# Patient Record
Sex: Female | Born: 1940 | Race: White | Hispanic: No | Marital: Married | Smoking: Current every day smoker
Health system: Southern US, Community
[De-identification: ages and names within clinical notes are randomized; demographics above are authoritative.]

## PROBLEM LIST (undated history)

## (undated) DIAGNOSIS — H9190 Unspecified hearing loss, unspecified ear: Secondary | ICD-10-CM

## (undated) DIAGNOSIS — Z9289 Personal history of other medical treatment: Secondary | ICD-10-CM

## (undated) DIAGNOSIS — D649 Anemia, unspecified: Secondary | ICD-10-CM

## (undated) HISTORY — DX: Anemia, unspecified: D64.9

## (undated) HISTORY — PX: CARDIAC SURGERY: SHX584

## (undated) HISTORY — DX: Personal history of other medical treatment: Z92.89

## (undated) HISTORY — DX: Unspecified hearing loss, unspecified ear: H91.90

---

## 2004-10-10 ENCOUNTER — Ambulatory Visit: Payer: Self-pay | Admitting: Surgery

## 2004-10-16 ENCOUNTER — Ambulatory Visit: Payer: Self-pay | Admitting: Surgery

## 2005-02-27 ENCOUNTER — Ambulatory Visit: Payer: Self-pay | Admitting: Ophthalmology

## 2005-03-04 ENCOUNTER — Ambulatory Visit: Payer: Self-pay | Admitting: Ophthalmology

## 2007-04-27 ENCOUNTER — Emergency Department: Payer: Self-pay | Admitting: Emergency Medicine

## 2007-04-27 ENCOUNTER — Other Ambulatory Visit: Payer: Self-pay

## 2008-06-05 ENCOUNTER — Ambulatory Visit: Payer: Self-pay | Admitting: Internal Medicine

## 2009-04-16 ENCOUNTER — Inpatient Hospital Stay: Payer: Self-pay | Admitting: Internal Medicine

## 2010-09-24 ENCOUNTER — Ambulatory Visit: Payer: Self-pay | Admitting: Internal Medicine

## 2011-04-15 ENCOUNTER — Ambulatory Visit: Payer: Self-pay | Admitting: Internal Medicine

## 2011-04-21 ENCOUNTER — Ambulatory Visit: Payer: Self-pay | Admitting: Internal Medicine

## 2011-05-26 ENCOUNTER — Ambulatory Visit: Payer: Self-pay | Admitting: Internal Medicine

## 2011-06-15 ENCOUNTER — Ambulatory Visit: Payer: Self-pay | Admitting: Internal Medicine

## 2011-07-16 ENCOUNTER — Ambulatory Visit: Payer: Self-pay | Admitting: Internal Medicine

## 2012-04-13 ENCOUNTER — Ambulatory Visit: Payer: Self-pay | Admitting: Ophthalmology

## 2012-04-26 ENCOUNTER — Ambulatory Visit: Payer: Self-pay | Admitting: Ophthalmology

## 2012-10-18 ENCOUNTER — Ambulatory Visit: Payer: Self-pay | Admitting: Internal Medicine

## 2012-11-15 ENCOUNTER — Ambulatory Visit: Payer: Self-pay | Admitting: Internal Medicine

## 2013-09-28 ENCOUNTER — Encounter: Payer: Self-pay | Admitting: *Deleted

## 2013-09-29 ENCOUNTER — Telehealth: Payer: Self-pay | Admitting: *Deleted

## 2013-09-29 NOTE — Telephone Encounter (Signed)
Phone call from friend/family member stating that the cyst area on the patients leg is now painful.  Aware no sooner appointment available.  Advised to check with Dr Juel Burrow so he is aware of the change to see if care is needed prior to appointment (antibiotic) on Oct 29.  She was not with the patient so she is not sure if the area is red or inflamed.  Aware to use tylenol or advil and possibly try ice or heat to the area for comfort.

## 2013-09-30 ENCOUNTER — Ambulatory Visit: Payer: Self-pay | Admitting: Internal Medicine

## 2013-10-06 ENCOUNTER — Ambulatory Visit: Payer: Self-pay | Admitting: Internal Medicine

## 2013-10-12 ENCOUNTER — Encounter: Payer: Self-pay | Admitting: General Surgery

## 2013-10-12 ENCOUNTER — Ambulatory Visit (INDEPENDENT_AMBULATORY_CARE_PROVIDER_SITE_OTHER): Payer: Medicare Other | Admitting: General Surgery

## 2013-10-12 VITALS — BP 92/62 | HR 66 | Resp 12 | Ht <= 58 in | Wt 87.0 lb

## 2013-10-12 DIAGNOSIS — R2240 Localized swelling, mass and lump, unspecified lower limb: Secondary | ICD-10-CM | POA: Insufficient documentation

## 2013-10-12 DIAGNOSIS — R2241 Localized swelling, mass and lump, right lower limb: Secondary | ICD-10-CM

## 2013-10-12 DIAGNOSIS — R229 Localized swelling, mass and lump, unspecified: Secondary | ICD-10-CM

## 2013-10-12 NOTE — Patient Instructions (Addendum)
The patient is aware to call back for any questions or concerns. Use ice pack on and off for today May remove dressing in 2-3 days Steri strip will come off in one week.

## 2013-10-12 NOTE — Progress Notes (Signed)
Patient ID: Sarah Bright, female   DOB: 21-Apr-1941, 72 y.o.   MRN: [id number]  Chief Complaint  Patient presents with  . Other    right thigh cyst    HPI Sarah Bright is a 72 y.o. female.  Here today for evaluation of a right thigh mass that has been there for at least 2 months. Referred by Dr. Juel Burrow.  Denies pain.  She is present today with her sister-in-law who is POA, she states that it does seem to have gotten larger over the past 2 months. HPI  Past Medical History  Diagnosis Date  . Anemia   . History of blood transfusion   . Hearing loss     Past Surgical History  Procedure Laterality Date  . 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 No pertinent family history.  Social History History  Substance Use Topics  . Smoking status: Current Every Day Smoker -- 0.25 packs/day for 55 years    Types: Cigarettes  . Smokeless tobacco: Not on file  . Alcohol Use: Not on file    No Known Allergies  Current Outpatient Prescriptions  Medication Sig Dispense Refill  . aspirin 81 MG tablet Take 81 mg by mouth daily.      . Calcium Carbonate-Vitamin D 600-400 MG-UNIT per tablet Take 1 tablet by mouth daily.      . digoxin (LANOXIN) 0.125 MG tablet Take 0.125 mg by mouth daily.      Marland Kitchen diltiazem (CARDIZEM) 120 MG tablet Take 120 mg by mouth daily.      Marland Kitchen gemfibrozil (LOPID) 600 MG tablet Take 600 mg by mouth daily.      . Multiple Vitamin (MULTIVITAMIN) tablet Take 1 tablet by mouth daily.      Marland Kitchen omeprazole (PRILOSEC) 20 MG capsule Take 20 mg by mouth daily.      . simvastatin (ZOCOR) 20 MG tablet Take 20 mg by mouth daily.      Marland Kitchen tiotropium (SPIRIVA) 18 MCG inhalation capsule Place 18 mcg into inhaler and inhale daily.       No current facility-administered medications for this visit.    Review of Systems Review of Systems  Constitutional: Negative.   Respiratory: Negative.   Cardiovascular: Negative.     Blood pressure 92/62, pulse 66, resp.  rate 12, height 4\' 10"  (1.473 m), weight 87 lb (39.463 kg).  Physical Exam Physical Exam  Constitutional: She is oriented to person, place, and time. She appears well-developed.  Eyes: Conjunctivae are normal. No scleral icterus.  Neck: Neck supple. No thyromegaly present.  Cardiovascular: Normal rate, regular rhythm and normal heart sounds.   Pulses:      Dorsalis pedis pulses are 2+ on the right side, and 2+ on the left side.       Posterior tibial pulses are 2+ on the right side, and 2+ on the left side.  No lower leg edema  Pulmonary/Chest: Effort normal and breath sounds normal.  Abdominal: Soft. Normal appearance and bowel sounds are normal. No hernia.  Lymphadenopathy:    She has no cervical adenopathy.       Right: No inguinal adenopathy present.       Left: No inguinal adenopathy present.  Neurological: She is alert and oriented to person, place, and time.  Skin: Skin is warm and dry.  Right thigh soft tissue mass 3-4 cm firm deep seated mass inner right thigh junction of medial lower third. Appears  subfascial.    Data Reviewed Office notes. US showed a large subfascial mass and core biopsy completed.  Assessment    Right thigh soft tissue mass 3-4 cm firm deep seeded mass inner right thigh junction of medial lower third     Plan    Await path report. If benign would recommend simple excision in SDS. Discussed fully with pt's POA.      Targeted US of mass of right lower inner thigh reveals a 4 cm long, 2.5cm deep isoechoic mass. It is subfascial in location.  Borders are indistinct. 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 Core biopsy recommended and completed today with consent.  3 ml of 1% xylocaine mixed with 0.5% marcaine was instilled. After skin prep with Chloraprep, tiny stab incision made. Bard core biopsy device was used with US guidance and 4 cores obtained. Sent for pathology. Streistrip, telfa and tegaderm applied. Procedure  tolerated well. SANKAR,SEEPLAPUTHUR G  10/13/2013, 6:36 AM

## 2013-10-13 ENCOUNTER — Encounter: Payer: Self-pay | Admitting: General Surgery

## 2013-10-14 ENCOUNTER — Ambulatory Visit: Payer: Self-pay | Admitting: Internal Medicine

## 2013-10-18 ENCOUNTER — Ambulatory Visit: Payer: Self-pay | Admitting: Internal Medicine

## 2013-10-20 LAB — PATHOLOGY

## 2013-10-26 ENCOUNTER — Encounter: Payer: Self-pay | Admitting: General Surgery

## 2013-11-02 ENCOUNTER — Ambulatory Visit: Payer: Self-pay | Admitting: Internal Medicine

## 2013-11-05 LAB — BRONCHIAL WASH CULTURE

## 2013-11-14 ENCOUNTER — Ambulatory Visit: Payer: Self-pay | Admitting: Internal Medicine

## 2013-11-14 LAB — CBC WITH DIFFERENTIAL/PLATELET
Basophil #: 0.1 10*3/uL (ref 0.0–0.1)
Basophil %: 0.7 %
Eosinophil #: 0.6 10*3/uL (ref 0.0–0.7)
MCH: 31.6 pg (ref 26.0–34.0)
MCHC: 34.4 g/dL (ref 32.0–36.0)
Monocyte #: 0.6 x10 3/mm (ref 0.2–0.9)
Monocyte %: 6.1 %
Neutrophil #: 7 10*3/uL — ABNORMAL HIGH (ref 1.4–6.5)
Platelet: 302 10*3/uL (ref 150–440)
RBC: 4.2 10*6/uL (ref 3.80–5.20)
RDW: 14 % (ref 11.5–14.5)
WBC: 10.4 10*3/uL (ref 3.6–11.0)

## 2013-11-14 LAB — PROTIME-INR: Prothrombin Time: 13.1 secs (ref 11.5–14.7)

## 2013-11-22 LAB — PATHOLOGY REPORT

## 2013-11-23 LAB — CULTURE, FUNGUS WITHOUT SMEAR

## 2013-11-24 ENCOUNTER — Other Ambulatory Visit (HOSPITAL_COMMUNITY): Payer: Self-pay | Admitting: Internal Medicine

## 2013-11-24 DIAGNOSIS — R918 Other nonspecific abnormal finding of lung field: Secondary | ICD-10-CM

## 2013-11-25 ENCOUNTER — Encounter (HOSPITAL_COMMUNITY): Payer: Self-pay | Admitting: Pharmacy Technician

## 2013-11-25 ENCOUNTER — Other Ambulatory Visit (HOSPITAL_COMMUNITY): Payer: Self-pay | Admitting: Radiology

## 2013-11-28 ENCOUNTER — Ambulatory Visit (HOSPITAL_COMMUNITY)
Admission: RE | Admit: 2013-11-28 | Discharge: 2013-11-28 | Disposition: A | Discharge status: Home / Self Care | Payer: Medicare Other | Source: Ambulatory Visit | Attending: Internal Medicine | Admitting: Internal Medicine

## 2013-11-28 ENCOUNTER — Encounter (HOSPITAL_COMMUNITY): Payer: Self-pay

## 2013-11-28 DIAGNOSIS — Z79899 Other long term (current) drug therapy: Secondary | ICD-10-CM | POA: Insufficient documentation

## 2013-11-28 DIAGNOSIS — H9190 Unspecified hearing loss, unspecified ear: Secondary | ICD-10-CM | POA: Insufficient documentation

## 2013-11-28 DIAGNOSIS — C3410 Malignant neoplasm of upper lobe, unspecified bronchus or lung: Secondary | ICD-10-CM | POA: Insufficient documentation

## 2013-11-28 DIAGNOSIS — Z7982 Long term (current) use of aspirin: Secondary | ICD-10-CM | POA: Insufficient documentation

## 2013-11-28 DIAGNOSIS — R918 Other nonspecific abnormal finding of lung field: Secondary | ICD-10-CM

## 2013-11-28 DIAGNOSIS — D649 Anemia, unspecified: Secondary | ICD-10-CM | POA: Insufficient documentation

## 2013-11-28 DIAGNOSIS — F17200 Nicotine dependence, unspecified, uncomplicated: Secondary | ICD-10-CM | POA: Insufficient documentation

## 2013-11-28 DIAGNOSIS — C3490 Malignant neoplasm of unspecified part of unspecified bronchus or lung: Secondary | ICD-10-CM | POA: Insufficient documentation

## 2013-11-28 LAB — APTT: aPTT: 27 seconds (ref 24–37)

## 2013-11-28 LAB — CBC
HCT: 38 % (ref 36.0–46.0)
MCHC: 33.9 g/dL (ref 30.0–36.0)
Platelets: 250 10*3/uL (ref 150–400)
RDW: 14.4 % (ref 11.5–15.5)
WBC: 8.1 10*3/uL (ref 4.0–10.5)

## 2013-11-28 LAB — PROTIME-INR
INR: 1.01 (ref 0.00–1.49)
Prothrombin Time: 13.1 seconds (ref 11.6–15.2)

## 2013-11-28 MED ORDER — SODIUM CHLORIDE 0.9 % IV SOLN
Freq: Once | INTRAVENOUS | Status: AC
  Administered 2013-11-28: 12:00:00 via INTRAVENOUS

## 2013-11-28 MED ORDER — LIDOCAINE HCL 1 % IJ SOLN
INTRAMUSCULAR | Status: AC
  Filled 2013-11-28: qty 10

## 2013-11-28 MED ORDER — FENTANYL CITRATE 0.05 MG/ML IJ SOLN
INTRAMUSCULAR | Status: AC
  Filled 2013-11-28: qty 4

## 2013-11-28 MED ORDER — SODIUM CHLORIDE 0.9 % IV SOLN
INTRAVENOUS | Status: AC | PRN
  Administered 2013-11-28: 75 mL/h via INTRAVENOUS

## 2013-11-28 MED ORDER — LORAZEPAM 2 MG/ML IJ SOLN
INTRAMUSCULAR | Status: AC | PRN
  Administered 2013-11-28: 1 mg via INTRAVENOUS

## 2013-11-28 MED ORDER — FENTANYL CITRATE 0.05 MG/ML IJ SOLN
INTRAMUSCULAR | Status: AC | PRN
  Administered 2013-11-28 (×2): 25 ug via INTRAVENOUS

## 2013-11-28 MED ORDER — MIDAZOLAM HCL 2 MG/2ML IJ SOLN
INTRAMUSCULAR | Status: AC
  Filled 2013-11-28: qty 4

## 2013-11-28 NOTE — ED Notes (Signed)
MD aware of BP, fluid bolus being given.

## 2013-11-28 NOTE — ED Notes (Signed)
MD at bedside.  Speaking with pt and family.

## 2013-11-28 NOTE — H&P (Signed)
Chief Complaint: Lung mass  HPI: Sarah Bright is an 72 y.o. female who is being worked up for a left lung mass. She underwent LUL biopsy 2 weeks ago and tolerated it well. However, he pathology was not conclusive. She is scheduled for another attempt. She has done well since last procedure. No new c/o, meds, or allergies.  Past Medical History:  Past Medical History  Diagnosis Date  . Anemia   . History of blood transfusion   . Hearing loss     Past Surgical History:  Past Surgical History  Procedure Laterality Date  . 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 No pertinent family history.  Social History:  reports that she has been smoking Cigarettes.  She has a 13.75 pack-year smoking history. She does not have any smokeless tobacco history on file. She reports that she does not use illicit drugs. Her alcohol history is not on file.  Allergies: No Known Allergies  Medications:   Medication List    ASK your doctor about these medications       aspirin 81 MG tablet  Take 81 mg by mouth daily.     calcium carbonate 600 MG Tabs tablet  Commonly known as:  OS-CAL  Take 600 mg by mouth daily with breakfast.     digoxin 0.125 MG tablet  Commonly known as:  LANOXIN  Take 0.0625 mg by mouth daily.     diltiazem 120 MG 24 hr capsule  Commonly known as:  CARDIZEM CD  Take 240 mg by mouth daily.     ferrous gluconate 324 MG tablet  Commonly known as:  FERGON  Take 324 mg by mouth daily with breakfast.     gemfibrozil 600 MG tablet  Commonly known as:  LOPID  Take 600 mg by mouth daily.     multivitamin tablet  Take 1 tablet by mouth daily.     omeprazole 20 MG capsule  Commonly known as:  PRILOSEC  Take 20 mg by mouth daily.     simvastatin 20 MG tablet  Commonly known as:  ZOCOR  Take 20 mg by mouth daily.     tiotropium 18 MCG inhalation capsule  Commonly known as:  SPIRIVA  Place 18 mcg into inhaler and inhale  daily.        Please HPI for pertinent positives, otherwise complete 10 system ROS negative.  Physical Exam: Temp: 98.1, HR: 103, RR: 20, BP: 103/56   General Appearance:  Alert, cooperative, no distress, appears stated age  Head:  Normocephalic, without obvious abnormality, atraumatic  ENT: Unremarkable  Neck: Supple, symmetrical, trachea midline  Lungs:   Clear to auscultation bilaterally, no w/r/r  Chest Wall:  No tenderness or deformity  Heart:  Regular rate and rhythm, S1, S2 normal, no murmur, rub or gallop.  Neurologic: Normal affect, no gross deficits.   No results found for this or any previous visit (from the past 48 hour(s)). No results found.  Assessment/Plan LUL lung mass For repeat CT guided biopsy today Re-explained procedure to pt and family, including POA Risks of infection, bleeding/hemorrhage, PTX/chest tube and use of sedation discussed. Labs pending Consent signed in chart  Brayton El PA-C 11/28/2013, 12:12 PM

## 2013-11-28 NOTE — H&P (Signed)
Agree.  For repeat biopsy of LUL mass under CT guidance.

## 2013-11-28 NOTE — ED Notes (Signed)
IV fluids increased for BP.  Dr Fredia Sorrow in to see pt and family.

## 2013-11-28 NOTE — ED Notes (Signed)
BP cuff  moved to leg

## 2013-11-28 NOTE — Procedures (Signed)
Procedure:  CT guided core biopsy of LUL lung mass Findings:  Posterolateral margin of LUL mass targeted for repeat biopsy.  18 G core biopsy samples x 5 via 17 G needle.  No PTX by CT.

## 2013-11-28 NOTE — ED Notes (Signed)
O2 d/c'd 

## 2013-12-01 ENCOUNTER — Ambulatory Visit: Payer: Self-pay | Admitting: Internal Medicine

## 2013-12-12 ENCOUNTER — Ambulatory Visit: Payer: Self-pay | Admitting: Internal Medicine

## 2013-12-15 ENCOUNTER — Ambulatory Visit: Payer: Self-pay | Admitting: Internal Medicine

## 2013-12-29 LAB — CBC CANCER CENTER
Basophil #: 0.1 x10 3/mm (ref 0.0–0.1)
Basophil %: 0.6 %
EOS ABS: 0.5 x10 3/mm (ref 0.0–0.7)
Eosinophil %: 5.5 %
HCT: 37 % (ref 35.0–47.0)
HGB: 12.1 g/dL (ref 12.0–16.0)
Lymphocyte #: 1.6 x10 3/mm (ref 1.0–3.6)
Lymphocyte %: 16.5 %
MCH: 30 pg (ref 26.0–34.0)
MCHC: 32.6 g/dL (ref 32.0–36.0)
MCV: 92 fL (ref 80–100)
Monocyte #: 1 x10 3/mm — ABNORMAL HIGH (ref 0.2–0.9)
Monocyte %: 10.6 %
Neutrophil #: 6.5 x10 3/mm (ref 1.4–6.5)
Neutrophil %: 66.8 %
Platelet: 302 x10 3/mm (ref 150–440)
RBC: 4.02 10*6/uL (ref 3.80–5.20)
RDW: 14.3 % (ref 11.5–14.5)
WBC: 9.7 x10 3/mm (ref 3.6–11.0)

## 2014-01-05 LAB — CBC CANCER CENTER
BASOS ABS: 0 x10 3/mm (ref 0.0–0.1)
BASOS PCT: 0.5 %
Eosinophil #: 0.3 x10 3/mm (ref 0.0–0.7)
Eosinophil %: 3.5 %
HCT: 34.7 % — ABNORMAL LOW (ref 35.0–47.0)
HGB: 11.5 g/dL — ABNORMAL LOW (ref 12.0–16.0)
LYMPHS ABS: 0.8 x10 3/mm — AB (ref 1.0–3.6)
LYMPHS PCT: 9.4 %
MCH: 29.7 pg (ref 26.0–34.0)
MCHC: 33.2 g/dL (ref 32.0–36.0)
MCV: 89 fL (ref 80–100)
MONO ABS: 1.1 x10 3/mm — AB (ref 0.2–0.9)
Monocyte %: 12.4 %
Neutrophil #: 6.5 x10 3/mm (ref 1.4–6.5)
Neutrophil %: 74.2 %
Platelet: 379 x10 3/mm (ref 150–440)
RBC: 3.88 10*6/uL (ref 3.80–5.20)
RDW: 14.4 % (ref 11.5–14.5)
WBC: 8.7 x10 3/mm (ref 3.6–11.0)

## 2014-01-12 LAB — CBC CANCER CENTER
BASOS ABS: 0 x10 3/mm (ref 0.0–0.1)
Basophil %: 0.4 %
Eosinophil #: 0.2 x10 3/mm (ref 0.0–0.7)
Eosinophil %: 2.6 %
HCT: 28.4 % — ABNORMAL LOW (ref 35.0–47.0)
HGB: 9.3 g/dL — ABNORMAL LOW (ref 12.0–16.0)
LYMPHS ABS: 0.6 x10 3/mm — AB (ref 1.0–3.6)
Lymphocyte %: 5.9 %
MCH: 28.9 pg (ref 26.0–34.0)
MCHC: 32.6 g/dL (ref 32.0–36.0)
MCV: 89 fL (ref 80–100)
Monocyte #: 1 x10 3/mm — ABNORMAL HIGH (ref 0.2–0.9)
Monocyte %: 10.6 %
NEUTROS ABS: 7.6 x10 3/mm — AB (ref 1.4–6.5)
NEUTROS PCT: 80.5 %
Platelet: 489 x10 3/mm — ABNORMAL HIGH (ref 150–440)
RBC: 3.21 10*6/uL — AB (ref 3.80–5.20)
RDW: 14.3 % (ref 11.5–14.5)
WBC: 9.4 x10 3/mm (ref 3.6–11.0)

## 2014-01-15 ENCOUNTER — Ambulatory Visit: Payer: Self-pay | Admitting: Radiation Oncology

## 2014-02-12 ENCOUNTER — Ambulatory Visit: Payer: Self-pay | Admitting: Radiation Oncology

## 2014-03-02 LAB — CBC CANCER CENTER
Basophil #: 0 x10 3/mm (ref 0.0–0.1)
Basophil %: 0.2 %
EOS PCT: 2.6 %
Eosinophil #: 0.3 x10 3/mm (ref 0.0–0.7)
HCT: 35.5 % (ref 35.0–47.0)
HGB: 11.2 g/dL — AB (ref 12.0–16.0)
Lymphocyte #: 0.8 x10 3/mm — ABNORMAL LOW (ref 1.0–3.6)
Lymphocyte %: 6.6 %
MCH: 26.4 pg (ref 26.0–34.0)
MCHC: 31.4 g/dL — AB (ref 32.0–36.0)
MCV: 84 fL (ref 80–100)
MONO ABS: 1.1 x10 3/mm — AB (ref 0.2–0.9)
MONOS PCT: 8.5 %
NEUTROS PCT: 82.1 %
Neutrophil #: 10.4 x10 3/mm — ABNORMAL HIGH (ref 1.4–6.5)
Platelet: 365 x10 3/mm (ref 150–440)
RBC: 4.22 10*6/uL (ref 3.80–5.20)
RDW: 17.8 % — ABNORMAL HIGH (ref 11.5–14.5)
WBC: 12.7 x10 3/mm — AB (ref 3.6–11.0)

## 2014-03-15 ENCOUNTER — Ambulatory Visit: Payer: Self-pay | Admitting: Radiation Oncology

## 2014-04-14 ENCOUNTER — Ambulatory Visit: Payer: Self-pay | Admitting: Internal Medicine

## 2014-04-14 ENCOUNTER — Ambulatory Visit: Payer: Self-pay | Admitting: Radiation Oncology

## 2014-05-15 ENCOUNTER — Ambulatory Visit: Payer: Self-pay | Admitting: Radiation Oncology

## 2014-06-14 ENCOUNTER — Ambulatory Visit: Payer: Self-pay | Admitting: Internal Medicine

## 2014-07-02 ENCOUNTER — Inpatient Hospital Stay: Payer: Self-pay | Admitting: Specialist

## 2014-07-02 LAB — BASIC METABOLIC PANEL
Anion Gap: 10 (ref 7–16)
BUN: 12 mg/dL (ref 7–18)
Calcium, Total: 9 mg/dL (ref 8.5–10.1)
Chloride: 94 mmol/L — ABNORMAL LOW (ref 98–107)
Co2: 23 mmol/L (ref 21–32)
Creatinine: 0.68 mg/dL (ref 0.60–1.30)
Glucose: 143 mg/dL — ABNORMAL HIGH (ref 65–99)
OSMOLALITY: 257 (ref 275–301)
Potassium: 4.7 mmol/L (ref 3.5–5.1)
SODIUM: 127 mmol/L — AB (ref 136–145)

## 2014-07-02 LAB — CBC
HCT: 38.7 % (ref 35.0–47.0)
HGB: 11.9 g/dL — ABNORMAL LOW (ref 12.0–16.0)
MCH: 25.1 pg — AB (ref 26.0–34.0)
MCHC: 30.7 g/dL — AB (ref 32.0–36.0)
MCV: 82 fL (ref 80–100)
Platelet: 336 10*3/uL (ref 150–440)
RBC: 4.73 10*6/uL (ref 3.80–5.20)
RDW: 17.2 % — ABNORMAL HIGH (ref 11.5–14.5)
WBC: 6.9 10*3/uL (ref 3.6–11.0)

## 2014-07-02 LAB — TROPONIN I: Troponin-I: <0.02 ng/mL

## 2014-07-03 LAB — BASIC METABOLIC PANEL
Anion Gap: 9 (ref 7–16)
BUN: 13 mg/dL (ref 7–18)
CREATININE: 0.68 mg/dL (ref 0.60–1.30)
Calcium, Total: 8.5 mg/dL (ref 8.5–10.1)
Chloride: 97 mmol/L — ABNORMAL LOW (ref 98–107)
Co2: 23 mmol/L (ref 21–32)
EGFR (African American): >60
EGFR (Non-African Amer.): >60
Glucose: 168 mg/dL — ABNORMAL HIGH (ref 65–99)
Osmolality: 263 (ref 275–301)
Potassium: 4.4 mmol/L (ref 3.5–5.1)
SODIUM: 129 mmol/L — AB (ref 136–145)

## 2014-07-03 LAB — CBC WITH DIFFERENTIAL/PLATELET
BASOS ABS: 0 10*3/uL (ref 0.0–0.1)
BASOS PCT: 0.2 %
Eosinophil #: 0 10*3/uL (ref 0.0–0.7)
Eosinophil %: 0 %
HCT: 29.3 % — ABNORMAL LOW (ref 35.0–47.0)
HGB: 9.2 g/dL — ABNORMAL LOW (ref 12.0–16.0)
LYMPHS PCT: 6.2 %
Lymphocyte #: 0.2 10*3/uL — ABNORMAL LOW (ref 1.0–3.6)
MCH: 25.3 pg — ABNORMAL LOW (ref 26.0–34.0)
MCHC: 31.4 g/dL — ABNORMAL LOW (ref 32.0–36.0)
MCV: 81 fL (ref 80–100)
MONOS PCT: 4.3 %
Monocyte #: 0.2 x10 3/mm (ref 0.2–0.9)
Neutrophil #: 3.3 10*3/uL (ref 1.4–6.5)
Neutrophil %: 89.3 %
PLATELETS: 220 10*3/uL (ref 150–440)
RBC: 3.63 10*6/uL — AB (ref 3.80–5.20)
RDW: 16.9 % — AB (ref 11.5–14.5)
WBC: 3.7 10*3/uL (ref 3.6–11.0)

## 2014-07-12 ENCOUNTER — Ambulatory Visit: Payer: Self-pay | Admitting: Internal Medicine

## 2014-07-15 ENCOUNTER — Ambulatory Visit: Payer: Self-pay | Admitting: Internal Medicine

## 2014-07-15 DEATH — deceased

## 2014-08-20 IMAGING — CT CT ASPIRATION
3 of 7 series · 19 of 46 positions shown, 26 images · non-contrast
Comparison: none

CLINICAL DATA: Left upper lobe mass. Smoking history. Nondiagnostic
bronchoscopy.

[Series 2: routine chest wo · axial · 0.71mm/px · z∈[+1096,+1206]mm · 7 of 30 slices shown]
[im 2/30  soft-tissue]
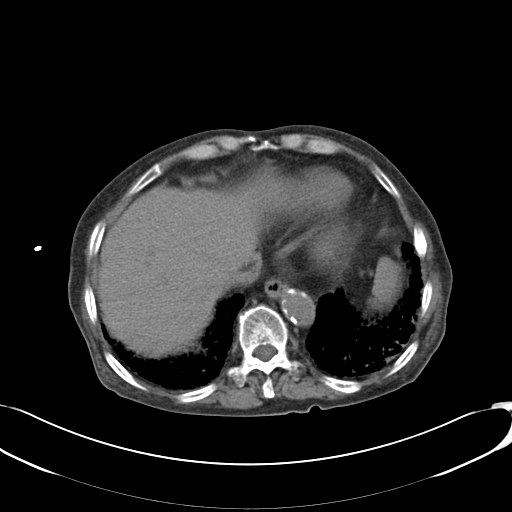
[im 6/30  soft-tissue]
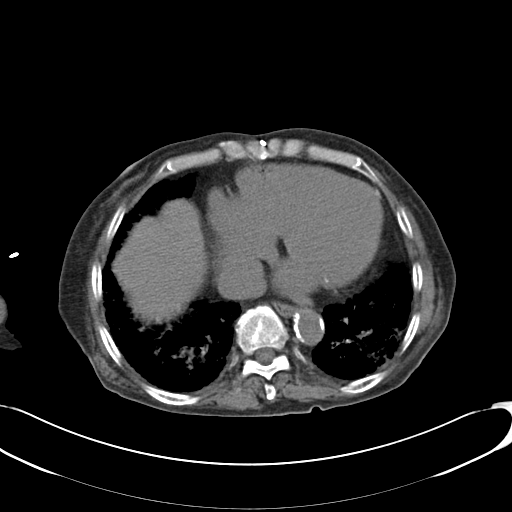
[im 10/30  soft-tissue]
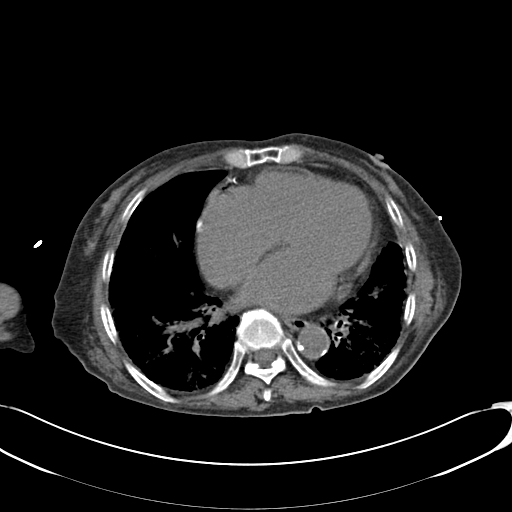
[im 14/30  soft-tissue]
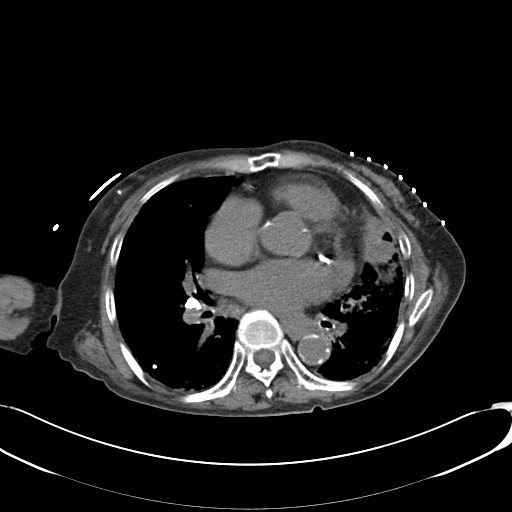
[im 16/30  soft-tissue]
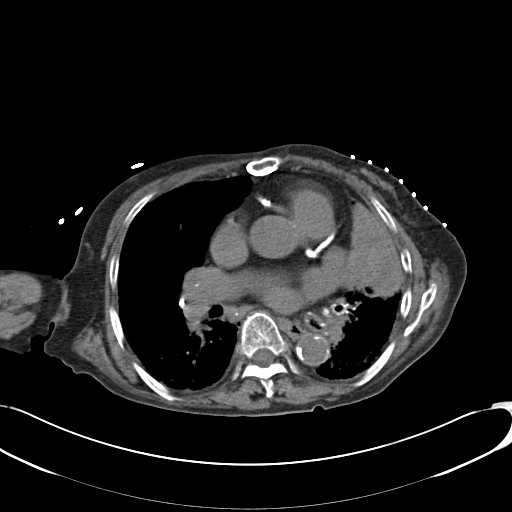
[im 20/30  soft-tissue]
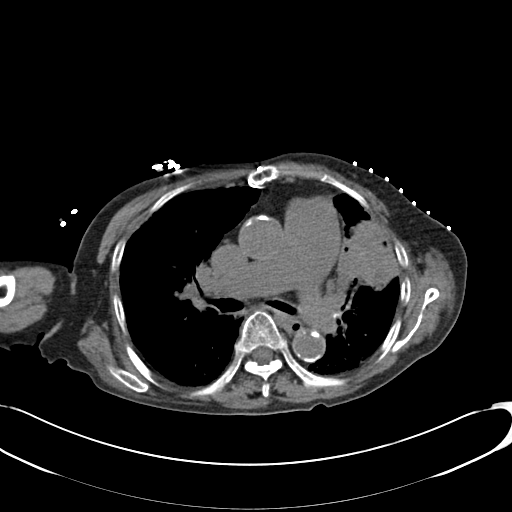
[im 24/30  soft-tissue]
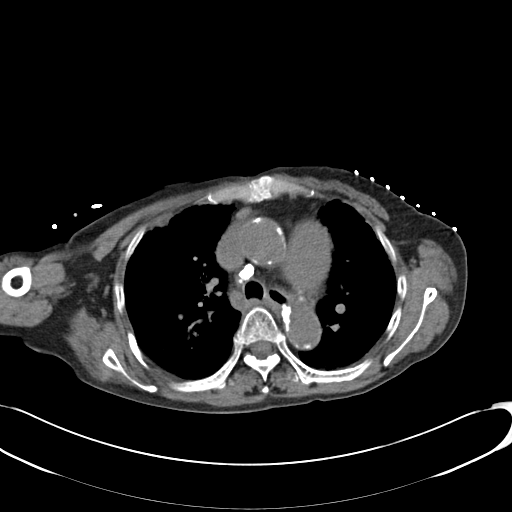

[Series 5: (hospital) 2.4 b30s · axial · 1.10mm/px · z∈[+1174,+1178]mm · 9 of 20 slices shown, 15 images]
[im 2/20  soft-tissue]
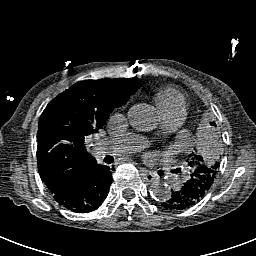
[im 2/20  bone]
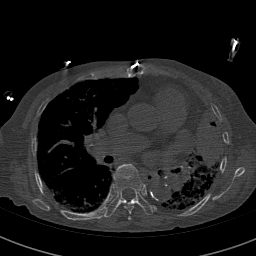
[im 4/20  soft-tissue]
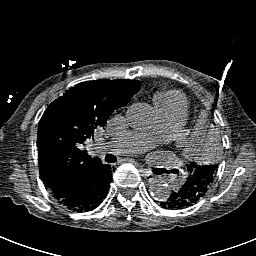
[im 6/20  soft-tissue]
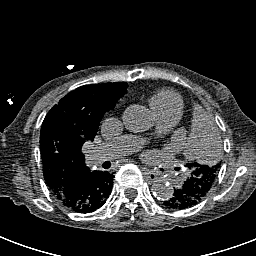
[im 6/20  lung]
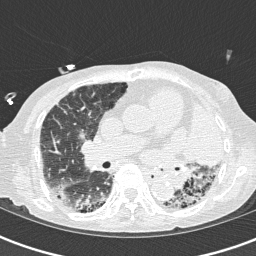
[im 8/20  soft-tissue]
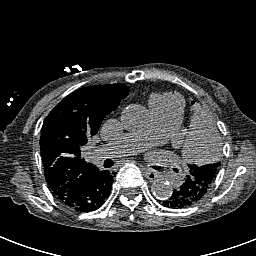
[im 8/20  lung]
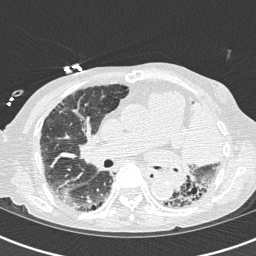
[im 10/20  soft-tissue]
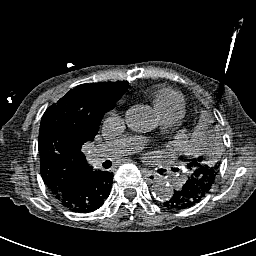
[im 10/20  lung]
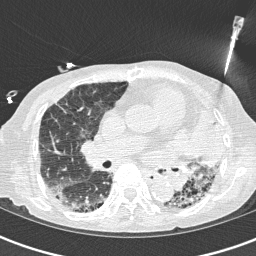
[im 12/20  soft-tissue]
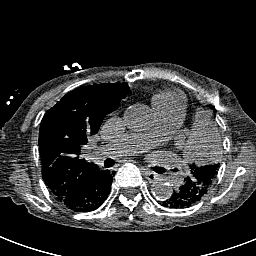
[im 14/20  soft-tissue]
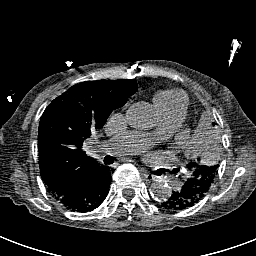
[im 16/20  soft-tissue]
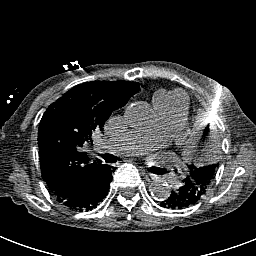
[im 18/20  soft-tissue]
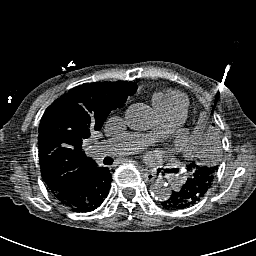
[im 18/20  lung]
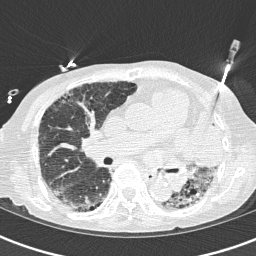
[im 18/20  bone]
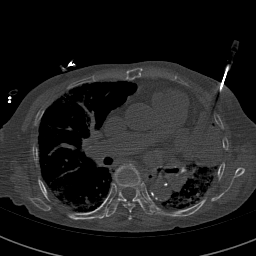

[Series 8: routine chest wo cor · coronal · 0.29mm/px · 3 of 103 slices shown, 4 images]
[im 26/103  soft-tissue]
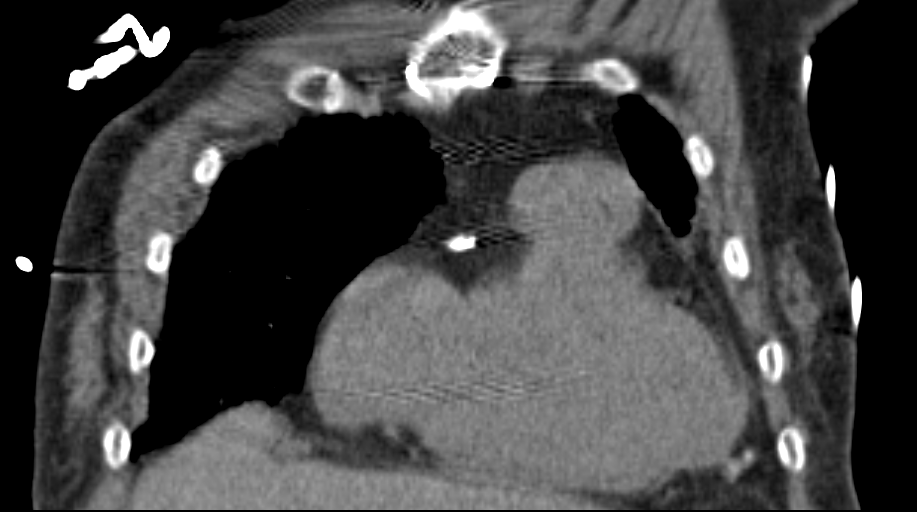
[im 52/103  soft-tissue]
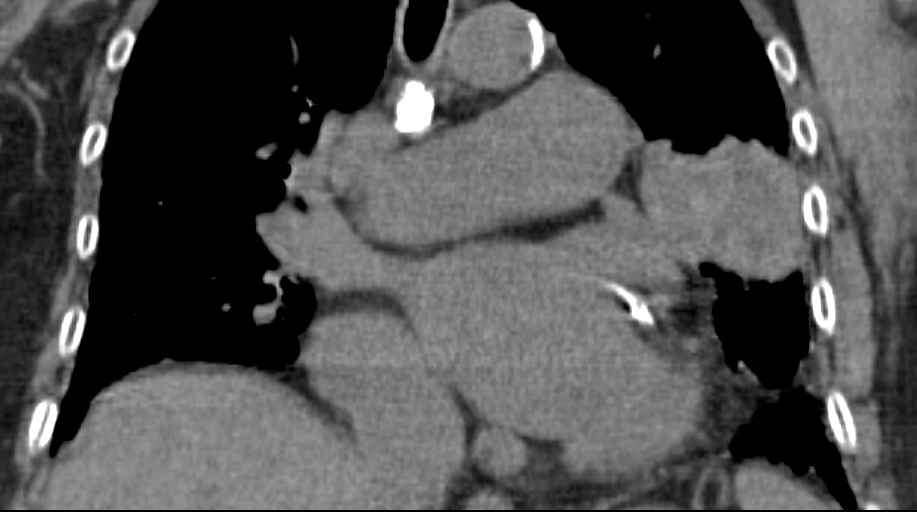
[im 52/103  bone]
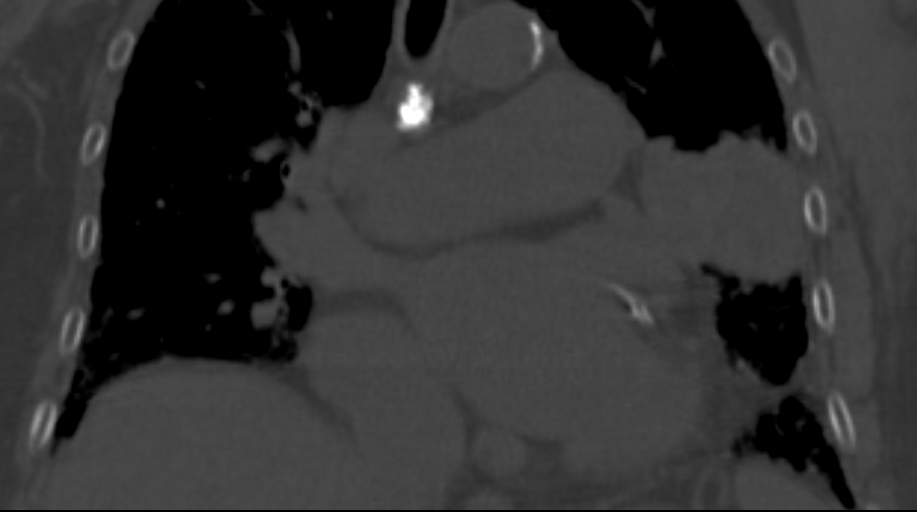
[im 77/103  soft-tissue]
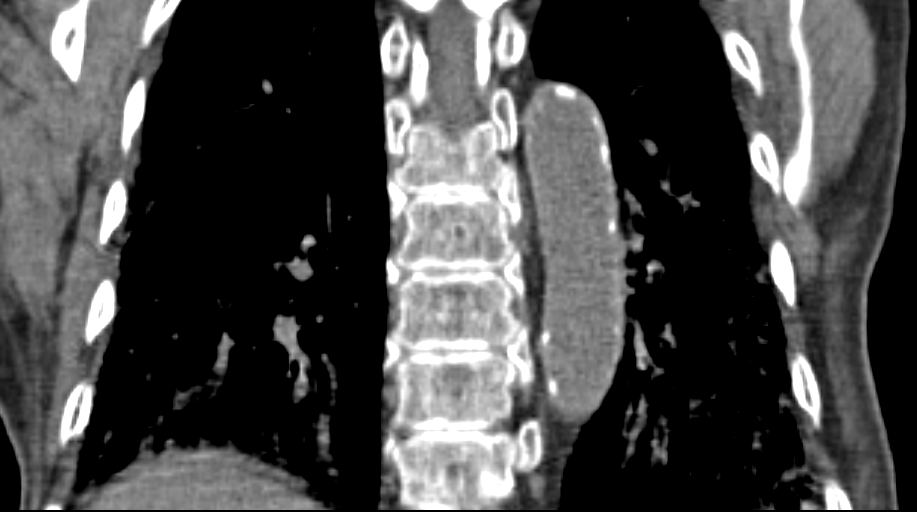

[19 of 46 positions shown; findings below may reference images not displayed]

EXAM:
CT GUIDED CORE BIOPSY OF LEFT LUNG

ANESTHESIA/SEDATION:
1.0  Mg IV Versed; 50 mcg IV Fentanyl

Total Moderate Sedation Time: 20 minutes.

PROCEDURE:
The procedure risks, benefits, and alternatives were explained to
the patient. Questions regarding the procedure were encouraged and
answered. The patient understands and consents to the procedure.

The left chest wall was prepped with chlorhexidinein a sterile
fashion, and a sterile drape was applied covering the operative
field. A sterile gown and sterile gloves were used for the
procedure. Local anesthesia was provided with 1% Lidocaine.

Preliminary CT was performed in a supine position. After localizing
a left upper lobe mass, a 17 gauge needle was advanced under CT
fluoroscopic guidance. After confirming needle tip position, a total
of 5 separate 18 gauge coaxial core biopsy samples were obtained.
Samples were submitted in formalin.

Complications: None.  No pneumothorax.
FINDINGS: Large area of peripheral consolidation/mass in the inferior left
upper lobe/lingula was localized. This area measures 6.5 cm in
greatest diameter. Solid tissue was obtained. Post biopsy imaging
shows no evidence of pneumothorax or adjacent hemorrhage.
IMPRESSION: CT-guided core biopsy performed of the large area of parenchymal
mass consolidation in the inferior left upper lobe/ lingula

## 2014-09-03 IMAGING — CT CT BIOPSY
1 of 2 series · 14 of 32 positions shown, 19 images · non-contrast
Comparison: none

CLINICAL DATA: 6 cm anterior left upper lobe lung mass. Recent
percutaneous biopsy was performed on 11/14/2013 due to nondiagnostic
bronchoscopy. Tissue from that biopsy showed abundant necrosis with
suspicious atypical cells. A definitive diagnosis of malignancy
could not be made and request has been made to repeat biopsy for
additional tissue.

[Series 2: i-spiral 5.0 b40f · axial · 0.95mm/px · z∈[+899,+997]mm · 14 of 32 slices shown, 19 images]
[im 2/32  soft-tissue]
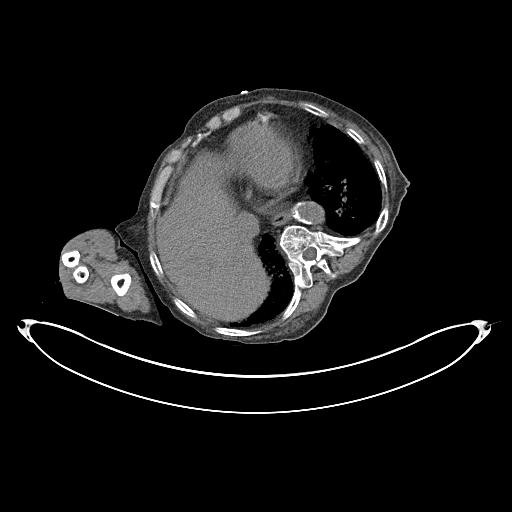
[im 2/32  bone]
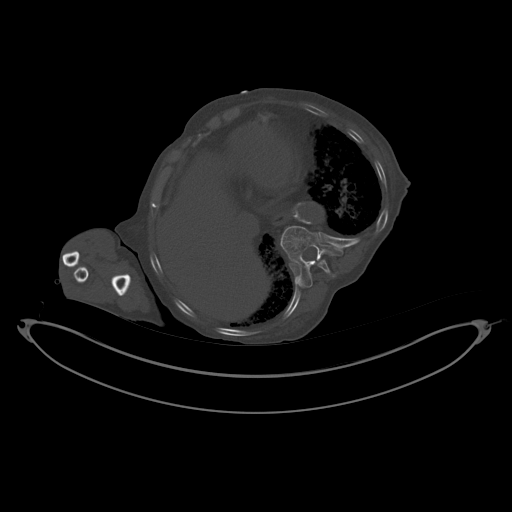
[im 4/32  soft-tissue]
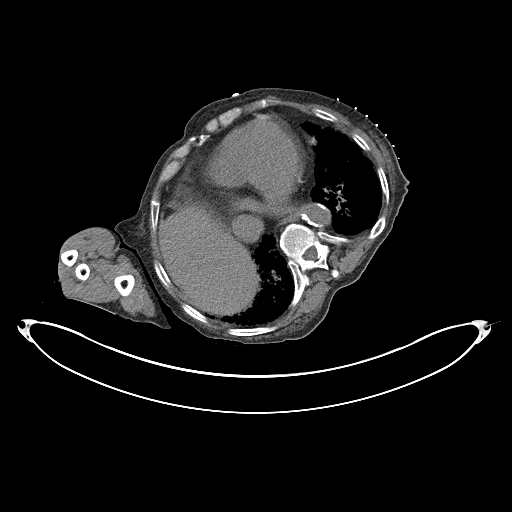
[im 8/32  soft-tissue]
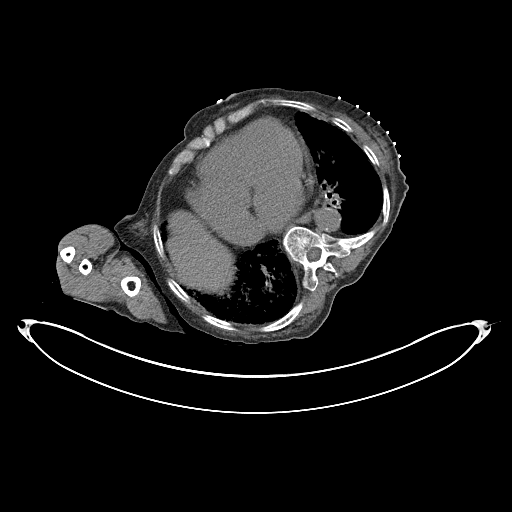
[im 10/32  soft-tissue]
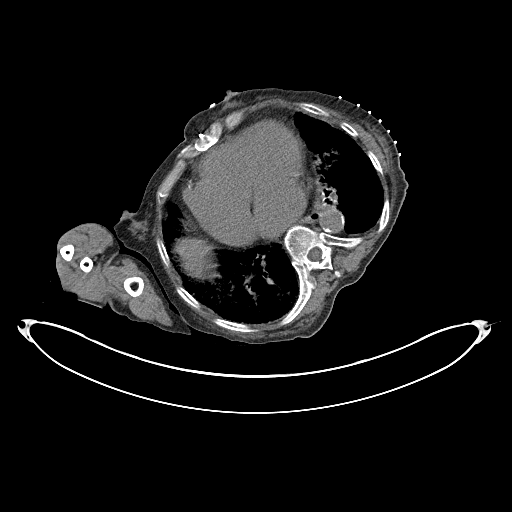
[im 11/32  soft-tissue]
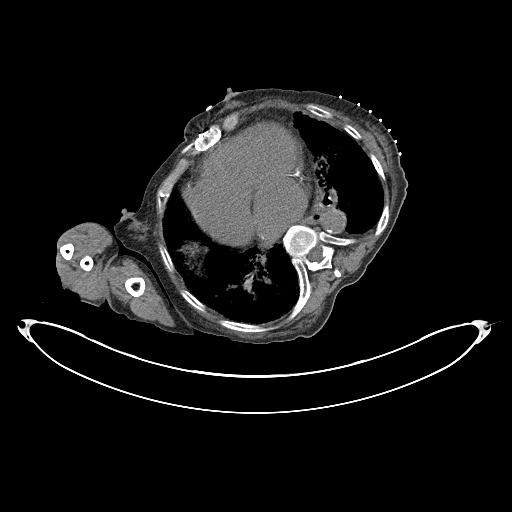
[im 13/32  soft-tissue]
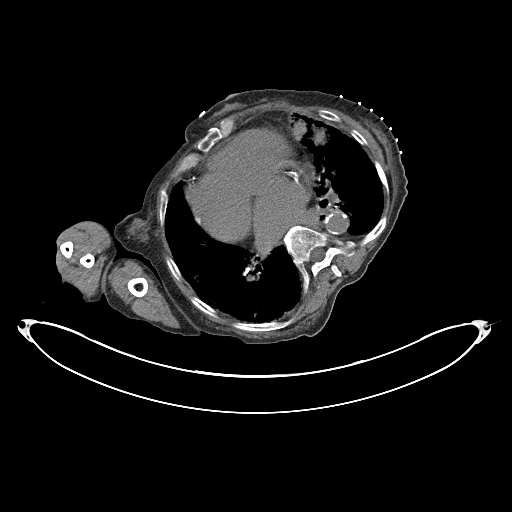
[im 17/32  soft-tissue]
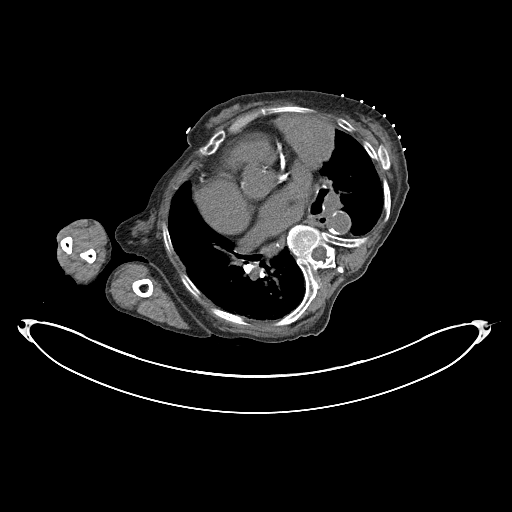
[im 19/32  soft-tissue]
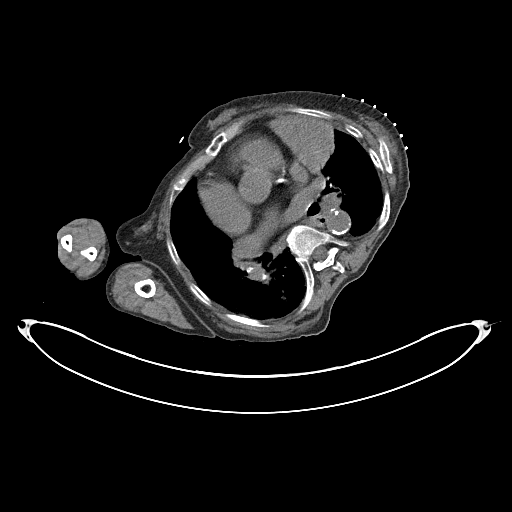
[im 21/32  soft-tissue]
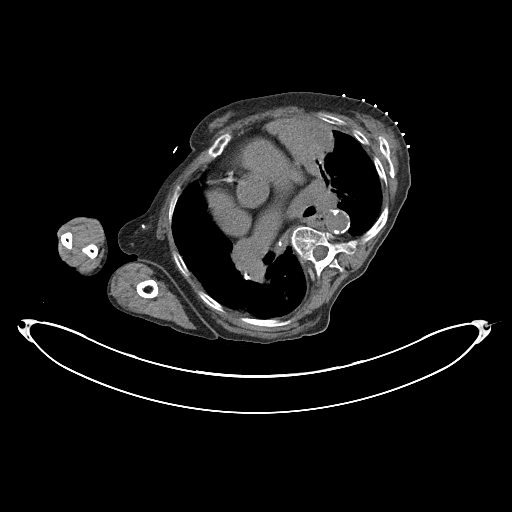
[im 21/32  bone]
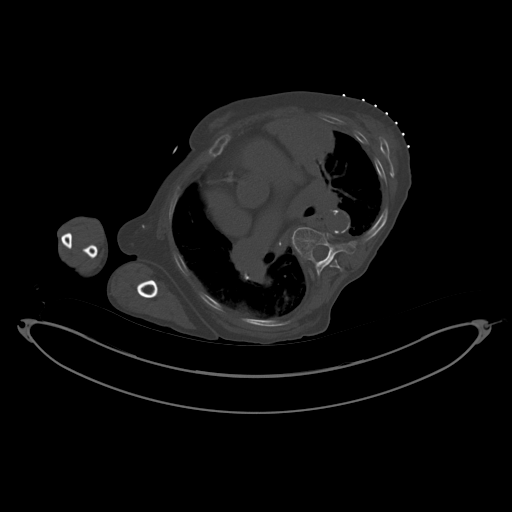
[im 22/32  soft-tissue]
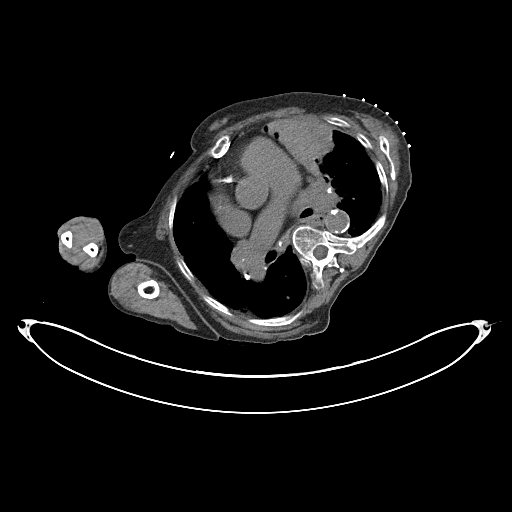
[im 24/32  soft-tissue]
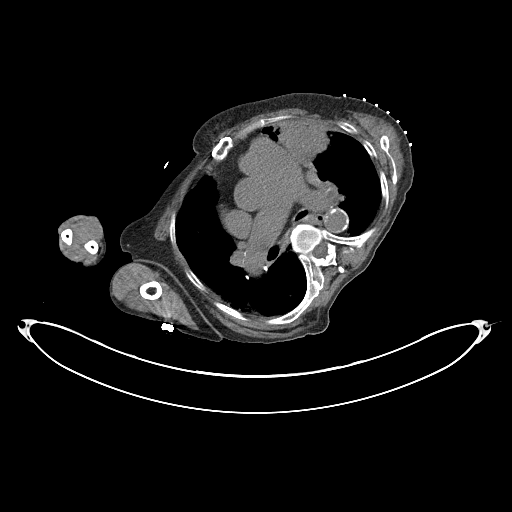
[im 24/32  lung]
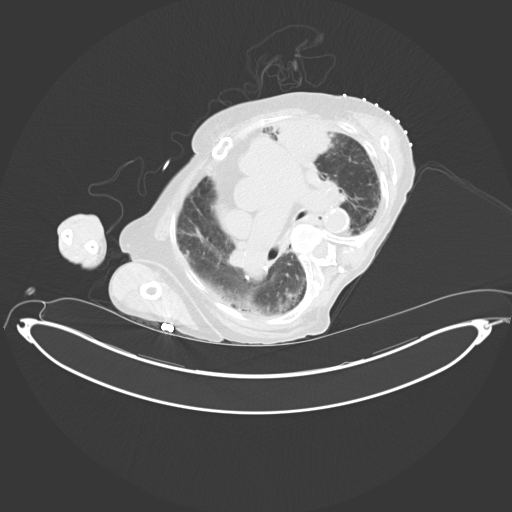
[im 26/32  lung]
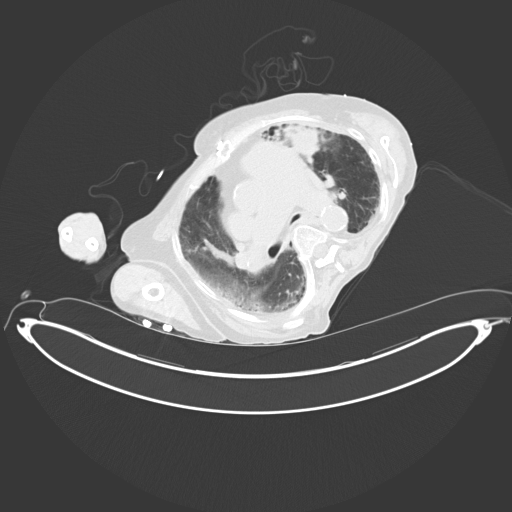
[im 28/32  soft-tissue]
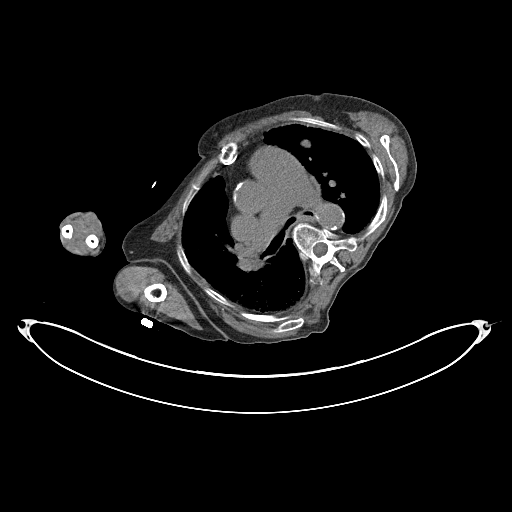
[im 28/32  lung]
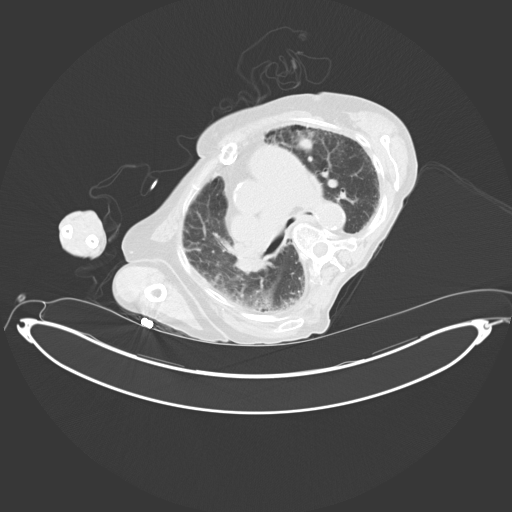
[im 30/32  soft-tissue]
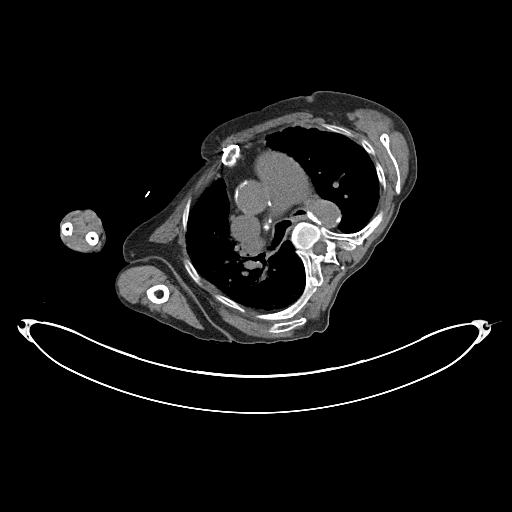
[im 30/32  lung]
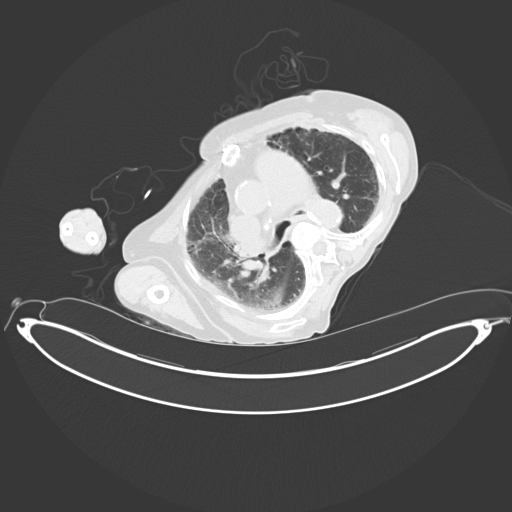

[14 of 32 positions shown; findings below may reference images not displayed]

EXAM:
CT GUIDED CORE BIOPSY OF LEFT LUNG

ANESTHESIA/SEDATION:
1.0  Mg IV Versed; 50 mcg IV Fentanyl

Total Moderate Sedation Time: 14 minutes.

PROCEDURE:
The procedure risks, benefits, and alternatives were explained to
the patient. Questions regarding the procedure were encouraged and
answered. The patient understands and consents to the procedure.

The left lateral chest wall was prepped with Betadinein a sterile
fashion, and a sterile drape was applied covering the operative
field. A sterile gown and sterile gloves were used for the
procedure. Local anesthesia was provided with 1% Lidocaine.

From a supine position, the left side was rolled up. CT was
performed. After localizing a site for biopsy, a 17 gauge needle was
advanced under CT fluoroscopic guidance into the posterior aspect of
the mass. Core biopsy was performed with a total of 5 separate 18
gauge core biopsy samples obtained and submitted in formalin. The
needle was removed and additional CT performed.

COMPLICATIONS:
None.  No pneumothorax.
FINDINGS: The large anterior left upper lobe mass was localized. The previous
percutaneous biopsy procedure was performed from an anterior
approach. The posterior margin of the mass was targeted for repeat
biopsy. Solid tissue was obtained.
IMPRESSION: CT-guided core biopsy performed of the large anterior left upper
lobe mass. Given that the previous biopsy was performed from an
anterior approach, the posterior margin of the mass was targeted
today for biopsy.

## 2015-04-06 NOTE — Consult Note (Signed)
Reason for Visit: This 74 year old Female patient presents to the clinic for initial evaluation of  lung cancer .   Referred by Dr. Ma Hillock.  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 condition not thought to be a candidate for systemic chemotherapy will have primary radiation therapy with curative intent  Pathology Report pathology reports reviewed.   Imaging Report PET CT scansand CT scans reviewed   Referral Report clinical notes reviewed   Planned Treatment Regimen radiation therapy with curative intent   HPI   patient is a 74 year old female of [place] descent who speaks limited English although is seen accompanied by her sister and brother-in-law today. She has presented with a productive cough also recently had a right thigh mass removed not sure the pathology but chest x-ray showed a left upper lobe lesion. Mass was measuring 6.7 x 4.3 cm box and possibly invades the great vessels and also involved the left interpleural surface. She had several biopsies the last one being positive for squamous cell carcinoma. She does have a mild nonproductive cough no significant weight loss no hemoptysis. She did she has been seen by medical oncology had PET CT scan today showing hypermetabolic activity in the left upper lobe mass. No definitive adenopathy was seen in the mediastinum although there was some borderline nodes. She is now referred to radiation oncology for opinion.  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   Social History positive   Social History Comments greater than 50-pack-year smoking history small doses of alcohol frequently   Additional Past Medical and Surgical History accompanied by her sister and brother-in-law today   Allergies:   No Known Allergies:   Home Meds:  Home Medications: Medication Instructions Status  simvastatin 20 mg oral tablet 1 tab(s) orally once a day (at bedtime) Active  ferrous sulfate 325 mg (65 mg elemental iron) oral tablet 1 tab(s) orally 2 times a day Active  omeprazole 20 mg oral delayed release capsule 1 cap(s) orally once a day Active  gemfibrozil 600 mg oral tablet 1 tab(s) orally 2 times a day Active  Spiriva 18 mcg inhalation capsule 1 each inhaled once a day Active  aspirin 81 mg oral tablet 1 tab(s) orally once a day Active  Lanoxin 125 mcg (0.125 mg) oral tablet 1/2 tab(s) orally once a day Active  Calcium 600+D 600 mg-200 units oral tablet 1 tab(s) orally once a day Active  multivitamin 1 tab(s) orally once a day Active   Review of Systems:  General negative   Performance Status (ECOG) 0   Skin negative   Breast negative   Ophthalmologic negative   ENMT negative   Respiratory and Thorax see HPI   Cardiovascular see HPI   Gastrointestinal negative   Genitourinary negative  Musculoskeletal negative   Neurological negative   Psychiatric negative   Hematology/Lymphatics negative   Endocrine negative   Allergic/Immunologic negative   Review of Systems   review of systems obtained from nurse's notes  Nursing Notes:  Nursing Vital Signs and Chemo Nursing Nursing Notes: *CC Vital Signs Flowsheet:   18-Dec-14 13:19  Temp Temperature 96.4  Pulse Pulse 96  Respirations Respirations 20  SBP SBP  110  DBP DBP 69  Pain Scale (0-10)  0  Current Weight (kg) (kg) 39.5  Height (cm) centimeters 147.3  BSA (m2) 1.2   Physical Exam:  General/Skin/HEENT:  General normal   Skin normal   Eyes normal   ENMT normal   Head and Neck normal   Additional PE thin well-developed [place] female in NAD. No cervical or supraclavicular adenopathy is appreciated. Lungs are clear to A&P cardiac examination shows irregular rhythm.. Abdomen is benign.   Breasts/Resp/CV/GI/GU:  Breasts normal   Respiratory and Thorax normal   Gastrointestinal normal   Genitourinary normal   MS/Neuro/Psych/Lymph:  Musculoskeletal normal   Neurological normal   Lymphatics normal   Other Results:  Radiology Results: LabUnknown:    01-Dec-14 11:54, CT Guided Biopsy (Specify Area)  PACS Image     18-Dec-14 11:53, PET/CT Scan Lung Cancer Initial Staging  PACS Image   CT:    01-Dec-14 11:54, CT Guided Biopsy (Specify Area)  CT Guided Biopsy (Specify Area)   REASON FOR EXAM:    Left lung mass Left upper lobe lung nodule  COMMENTS:       PROCEDURE: CT  - CT GUIDED BIOPSY or ASPIRATION  - Nov 14 2013 11:54AM     CLINICAL DATA:  Left upper lobe mass. Smoking history. Nondiagnostic  bronchoscopy.    EXAM:  CT GUIDED CORE BIOPSY OF LEFT LUNG    ANESTHESIA/SEDATION:  1.0  Mg IV Versed; 50 mcg IV Fentanyl    Total Moderate Sedation Time: 20 minutes.  PROCEDURE:  The procedure risks, benefits, and alternatives were explained to  the patient. Questions regarding the procedure were encouraged and  answered. The patient understands and consents to the procedure.    The left chest wall was prepped with chlorhexidinein a sterile  fashion, and a sterile drape was applied covering the operative  field. A sterile gown and sterile gloves were used for the  procedure. Local anesthesia was provided with 1% Lidocaine.    Preliminary CT was performed in a supine position. After localizing  a left upper lobe  mass, a 17 gauge needle was advanced under CT  fluoroscopic guidance. After confirming needle tip position, a total  of 5 separate 18 gauge coaxial core biopsy samples were obtained.  Samples were submitted in formalin.  Complications: None.  No pneumothorax.    FINDINGS:  Large area of peripheral consolidation/mass in the inferior left  upper lobe/lingula was localized. This area measures 6.5 cm in  greatest diameter. Solid tissue was obtained. Post biopsy imaging  shows no evidence of pneumothorax or adjacent hemorrhage.     IMPRESSION:  CT-guided core biopsy performed of the large area of parenchymal  mass consolidation in the inferior left upper lobe/ lingula      Electronically Signed    By: Aletta Edouard M.D.    On: 11/14/2013 12:13         Verified By: Azzie Roup, M.D.,  Nuclear Med:    18-Dec-14 11:53, PET/CT Scan Lung Cancer Initial Staging  PET/CT Scan Lung Cancer Initial Staging  REASON FOR EXAM:    left lung CA inital stage  COMMENTS:       PROCEDURE: PET - PET/CT INIT STAGING LUNG CA  - Dec 01 2013 11:53AM     CLINICAL DATA:  Initial treatment strategy for left lung cancer.    EXAM:  NUCLEAR MEDICINE PET SKULL BASE TO THIGH    FASTING BLOOD GLUCOSE:  Value: '81mg'$ /dl    TECHNIQUE:  11.3 mCi F-18 FDG was injected intravenously. CT data was obtained  and used for attenuation correction and anatomic localization only.  (This was not acquired as a diagnostic CT examination.) Additional  exam technical data entered on technologist worksheet.    COMPARISON:  CT 11/14/2013, 10/06/2013    FINDINGS:  NECK    No hypermetabolic lymph nodes in the neck.    CHEST    Consolidative mass in the left upper lobe measures 5.8 x 4.9 cm with  intense peripheral hypermetabolic activity with SUV max equal 8.5.  No additional hypermetabolic pulmonary nodules.  Partially calcified right lower paratracheal lymph node with no  significant metabolic activity.  Prevascular lymph node anterior to  the aorta measuring 8 mm (image 101) has mild metabolic activity  just above blood pool. There are several additional small  paratracheal lymph nodes with activity just above the blood pool. No  hypermetabolic supraclavicular nodes.    Severe interstitial lung disease with frank honeycombing at the lung  bases.    ABDOMEN/PELVIS    No abnormal metabolic activity within the liver or adrenal glands.  There is a focus of intense metabolic activity in the left upper  quadrant associated with the small bowel. Owing for misregistration  this activity, correlates to a rounded partially calcified nodule  within the small bowel measuring 15 mm (image 194, series 4). No  evidence of small bowel obstruction.    Within the deep left pelvis there is a focus of metabolic activity  is associated with colon (image 115 is fused series) which is felt  to be physiologic at there is no corresponding CT findings.    No hypermetabolic abdominal pelvic lymph nodes.    SKELETON    No focal hypermetabolic activity to suggest skeletal metastasis.     IMPRESSION:  1. Hypermetabolic left upper lobe mass consistent with primary  bronchogenic carcinoma.  2. Mild metabolic activity associated with minimally enlarged  mediastinal lymph nodes. This may represent reactive adenopathy from  severe interstitial lung disease. Metastasis is not favored but  cannot be excluded.  3. Focal metabolic activity within the left upper quadrant  associated with small bowel nodular density. This may represent a  primary smallbowel neoplasm such as carcinoid. Metastatic lungs  cancer id felt unlikely. Recommend CT enterography for further  evaluation and localization.      Electronically Signed    By: Suzy Bouchard M.D.    On: 12/01/2013 13:42         Verified By: Judi Cong. 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 Ma Hillock. He is not favor of doing systemic chemotherapy based on her other comorbidities. Would plan on delivering 7000 cGy to her chest. Would treat in a split course fashion of the  4000 cGy and reevaluate for a small field boost based on tumor response and patient tolerance. Risks and benefits of treatment including skin reaction, fatigue, possible radiation esophagitis, and destruction of some normal lung volume all were explained in detail to the patient. I have set her up for CT simulation right after Christmas vacation secondary to the family's wishes.  I would like to take this opportunity to thank you for allowing me to continue to participate in this patient's care.  CC Referral:  cc: Dr. Lavera Guise   Electronic Signatures: Baruch Gouty, Roda Shutters (MD)  (Signed 18-Dec-14 14:42)  Authored: HPI, Diagnosis, Past Hx, PFSH, Allergies, Home Meds, ROS, Nursing Notes, Physical Exam, Other Results, Relevent Results, Encounter Assessment and Plan, CC Referring Physician   Last Updated: 18-Dec-14 14:42 by Armstead Peaks (MD)

## 2015-04-07 NOTE — Discharge Summary (Signed)
PATIENT NAME:  Sarah Bright, Sarah Bright MR#:  [id number] DATE OF BIRTH:  1941/07/21  DATE OF ADMISSION:  07/02/2014 DATE OF DISCHARGE:  07/04/2014  For detailed note, please look at the history and physical done on admission.   DIAGNOSES AT DISCHARGE: As follows:  1.  Acute on chronic respiratory failure.  2.  Chronic obstructive pulmonary disease exacerbation.  3.  Lung cancer.  4.  Chronic atrial fibrillation.   DIET:  The patient is being discharged on a regular diet.   ACTIVITY: As tolerated.   DISPOSITION:  The patient is being discharged to hospice home.   DISCHARGE MEDICATIONS: As follows:  1.  Iron sulfate 325 mg b.i.d. 2.  Spiriva 1 puff daily. 3.  Aspirin 81 mg daily. 4.  Digoxin 125 mcg daily.  5.  Calcium with vitamin D 1 tab daily. 6.  Multivitamin daily. 7.  Cardizem CD 180 mg daily. 8.  Advair 250 one puff b.i.d. 9.  Roxanol 2.5 to 5 mL q. 2 hours as needed. 10.  Prednisone 60 mg daily x 2 days down to 10 mg daily over the next 12 days.  11.  Levaquin 750 mg daily x 5 days.   CONSULTANTS DURING THE HOSPITAL COURSE: Dr. Izora Gala Phifer, palliative care.   PERTINENT STUDIES DONE DURING THE HOSPITAL COURSE: Are as follows: A CT scan of the chest done with contrast on admission showing no evidence of pulmonary embolism. New confluent opacity noted in the left lower lobe. This may reflect a combination of residual mass and post-obstructive change. Superimposed pneumonia is not excluded. Cardiomegaly with small effusions, paraseptal emphysema.  Arteries: The main and right pulmonary arteries suggestive of pulmonary hypertension, swelling and ascites.   HOSPITAL COURSE: This is a 74 year old female who presented to the hospital with shortness of breath, progressively getting worse and noted to be in acute on chronic respiratory failure.   PROBLEMS: 1.  Acute on chronic respiratory failure. This was likely secondary to chronic obstructive pulmonary disease exacerbation. The patient was  treated aggressively with IV steroids, around-the-clock nebulizer treatments, and maintained on Advair and Spiriva, and Pulmicort nebulizers added along with empiric antibiotics with Levaquin.   2.  Chronic obstructive pulmonary disease exacerbation. This was likely secondary to lung cancer combined with ongoing tobacco abuse and possible underlying pneumonia. The patient was aggressively treated with IV steroids, around-the-clock nebulizer treatments, also maintained on Advair and Spiriva, and also empiric antibiotics as stated. The patient's shortness of breath has improved, but she continues to have significant shortness of breath just on minimal exertion still.   3.  Chronic atrial fibrillation. The patient has remained rate controlled. She will continue her Cardizem and digoxin. She is a high fall risk; therefore, is not on long-term anticoagulation. She will continue her aspirin.   4.  Hypertension. The patient has been hemodynamically stable on her Cardizem. She will continue that.   5.  Chronic anemia. The patient's hemoglobin has remained stable. She will continue iron supplements.   6.  Hyponatremia. 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 Sodium has remained stable while in the hospital course.  7.  History of lung cancer. The patient is currently getting palliative radiation.   The patient, despite getting aggressive IV steroids and inhalers and nebulizer treatments, continued to have significant exertional dyspnea. The patient apparently lives by herself and does not have any caretakers locally. The family members did not feel comfortable discharging her home with hospice services, given her significant symptomatic chronic  obstructive pulmonary disease.  She was, therefore, discharged to a hospice home for symptom management of her severe respiratory failure and lung cancer and chronic obstructive pulmonary disease exacerbation. The family was in agreement with this  plan. The patient is a DO NOT INTUBATE/DO NOT RESUSCITATE.   TIME SPENT ON DISCHARGE: 45 minutes.    ____________________________ Belia Heman. Verdell Carmine, MD vjs:ts D: 07/04/2014 14:59:05 ET T: 07/04/2014 15:26:02 ET JOB#: [id number]  cc: Belia Heman. Verdell Carmine, MD, <Dictator> Henreitta Leber MD ELECTRONICALLY SIGNED 07/13/2014 14:05

## 2015-04-07 NOTE — H&P (Signed)
PATIENT NAME:  Sarah Bright, Sarah Bright MR#:  [id number] DATE OF BIRTH:  [birth year]  DATE OF ADMISSION:  07/02/2014  PRIMARY CARE PHYSICIAN: Dr. Lavera Guise.   CHIEF COMPLAINT: Shortness of breath and wheezing.   HISTORY OF PRESENT ILLNESS: This is a 74 year old female who presented to the hospital due to shortness of breath and wheezing that has been progressively getting worse over the past few days. The patient is a poor historian as she does not speak [place] and is [place] speaking. Most of the history obtained from the sister-in-law at bedside. As per the sister-in-law, the patient lives by herself. Her shortness of breath and wheezing has gotten significantly worse over the past few days where even on minimal exertion she gets significantly winded and starts to wheeze. Today, when she was having has significantly difficult breathing she brought her to the ER. The patient's room air oxygen saturation on arrival were noted to be in the low 80s. The patient was put on some oxygen and noted to have diffuse wheezing. Hospitalist services were positive for further treatment and evaluation.   REVIEW OF SYSTEMS:  CONSTITUTIONAL: No documented fever, positive generalized weakness and fatigue. No weight gain or weight loss.  EYES: No blurred or double vision.  ENT: No tinnitus or postnasal drip. No redness of the oropharynx.  RESPIRATORY: Positive cough. Positive wheeze. No hemoptysis. Positive chronic obstructive pulmonary disease.  CARDIOVASCULAR: No chest pain, no orthopnea, no palpitations, no syncope.  GASTROINTESTINAL: No nausea, vomiting, diarrhea, no abdominal pain. No melena or hematochezia.  GENITOURINARY: No dysuria, hematuria.  ENDOCRINE: No polyuria or nocturia. No heat or cold intolerance cold intolerance.  HEMATOLOGIC: No anemia, no bruising. No bleeding.  INTEGUMENTARY: No rashes. No lesions.  MUSCULOSKELETAL: No arthritis. No swelling. No gout.  NEUROLOGIC: No numbness or tingling. No ataxia. No  seizure-type activity. PSYCHOLOGIC: No anxiety. No insomnia. No ADD.  No Depression.   PAST MEDICAL HISTORY: Consistent with history of lung cancer, chronic obstructive pulmonary disease with ongoing tobacco abuse, chronic atrial fibrillation, hypertension, chronic anemia, and GERD.   ALLERGIES: NO KNOWN DRUG ALLERGIES.   SOCIAL HISTORY: Still smokes about 1/2 pack per day, has been smoking for the past 50+ years. Also used to drink but quit about 15 years ago. No illicit drug abuse. Lives by her self. The patient cannot recall her family history.   CURRENT MEDICATIONS: Are as follows: Advair 250/50 1 puff b.i.d., Spiriva 1 puff daily, aspirin 81 mg daily, calcium vitamin D 1 tab b.i.d., Cardizem CD 180 mg daily, iron sulfate 325 mg b.i.d., digoxin 125 mcg 1/2 tab daily, multivitamin daily.   PHYSICAL EXAMINATION:  VITAL SIGNS: Presently is as follows: Temperature is 97.4, pulse 84, respirations 17, blood pressure 109/64. Sats 100% on 2 liters nasal cannula.  GENERAL: She is a lethargic, emaciated -appearing female in mild respiratory distress.  HEAD, EARS, EYES, NOSE, THROAT:  She is atraumatic, normocephalic. Her extraocular muscles are intact. Pupils are equal and reactive on to light. Sclerae anicteric. No conjunctival injection. No pharyngeal erythema.  NECK: Supple. There is no jugular venous distention. No bruits, no lymphadenopathy, no thyromegaly.  HEART: Regular rate and rhythm. No murmurs, no rubs, no clicks.  LUNGS: She has diffuse rhonchi and wheezing bilaterally. Negative use of accessory muscles. No dullness to percussion.  ABDOMEN: Soft, flat, nontender, nondistended. Has good bowel sounds. No hepatosplenomegaly appreciated.  EXTREMITIES: No evidence of any cyanosis, clubbing, or peripheral edema. She does have trace pedal edema on the left lower extremity as  compared to the right.  NEUROLOGIC: She is alert, awake, and oriented x 1. Difficult to do a full neurological exam due to  language barrier, but moves all extremities spontaneously. No focal deficits appreciated.  SKIN: Moist and warm. She does have a bruise on her left foot.  LYMPHATIC: There is no cervical or axillary lymphadenopathy.   LABORATORY DATA: Showed a serum glucose of 143, BUN 12, creatinine 0.6, sodium 127, potassium 4.7, chloride 94, bicarbonate 23. The patient's troponin less than 0.02. White cell count 6.9, hemoglobin 11.9, hematocrit 38.7, platelet count 236. The patient did have a chest x-ray done which showed left upper lobe masslike opacity consistent with known bronchogenic carcinoma. There is a hazy opacity which may reflect postobstructive change of radiation-induced pneumonitis, hazy left lower opacity may also be radiation-induced pneumonitis pneumonia is possible, interstitial fibrosis. The patient also had a CT scan of the chest done with contrast showing no pulmonary embolism, interstitial fibrosis with emphysema, small ascites and a possible left lower lobe postobstructive pneumonia.   ASSESSMENT AND PLAN: This is a 74 year old female with a history of lung cancer, chronic obstructive pulmonary disease, chronic atrial fibrillation, hypertension, chronic anemia who presents to the hospital with shortness of breath and cough progressively getting worse, noted to be in acute on chronic respiratory failure.  1. Acute on chronic respiratory failure. I suspect this is secondary to chronic obstructive pulmonary disease exacerbation. I will treat the patient with IV steroids, around-the-clock nebulizer treatments. Continue her Advair and Spiriva. Place her on empiric Levaquin. Follow sputum and blood cultures and follow her clinically.  2. Chronic obstructive pulmonary disease exacerbation. This is likely secondary to postobstructive pneumonia along with underlying lung cancer and ongoing tobacco abuse. I will treat the patient aggressively with IV steroids, around-the-clock nebulizer treatments. Continue  Advair, Spiriva. Add empiric Levaquin follow sputum and blood cultures. We will need to assess her for home oxygen prior to discharge.  3. Chronic atrial fibrillation, the patient is currently rate controlled. I will continue her Cardizem and digoxin. She is a high fall risk; therefore not on long-term anticoagulation. Continue aspirin. 4. Hypertension. Continue with her Cardizem. She is hemodynamically stable.  5. History of chronic anemia. Hemoglobin is stable. Continue with iron supplements.  6. Hyponatremia. This is mild. 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 I will go ahead and follow her sodium.  7. History of lung cancer. The patient is currently getting palliative radiation by Dr. Noreene Filbert. I will get a palliative care consult and discuss goals of care with the family. The patient is a DO NOT INTUBATE/DO NOT RESUSCITATE.   TIME SPENT ON ADMISSION: 50 minutes.    ____________________________ Belia Heman. Verdell Carmine, MD vjs:sg D: 07/02/2014 13:16:44 ET T: 07/02/2014 14:14:44 ET JOB#: [id number]  cc: Belia Heman. Verdell Carmine, MD, <Dictator> Henreitta Leber MD ELECTRONICALLY SIGNED 07/13/2014 14:04

## 2015-04-07 NOTE — Consult Note (Signed)
   Comments   Had meeting with Ranell Patrick, sister-in-law.  Lenell Antu states that a translator would be no use since pt has not spoke [place] in over 30 years and has forgotten language.  Lenell Antu confirms pt lives alone for last 8 years (husband passed 8 years ago).  Lenell Antu comes in and takes care of pt weekly and buys groceries for her.  Lenell Antu states pt does cook, clean , bathe and dress herself.  Lenell Antu states that pt gets SOB with exertion and is fine when sitting. Pt does not drive.  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  Pt retired 6 years ago. confirms, as do notes, that SCC is inoperable and no chemo is to be given. place hospice screen.  Electronic Signatures: Maudry Mayhew (NP)  (Signed 20-Jul-15 10:28)  Authored: Palliative Care Phifer, Izora Gala (MD)  (Signed 20-Jul-15 15:13)  Authored: Palliative Care   Last Updated: 20-Jul-15 15:13 by Phifer, Izora Gala (MD)

## 2015-04-08 NOTE — Op Note (Signed)
PATIENT NAME:  Sarah Bright, WAMPOLE MR#:  [id number] DATE OF BIRTH:  06-15-1941  DATE OF PROCEDURE:  04/26/2012  PREOPERATIVE DIAGNOSIS: Visually significant cataract of the left eye.   POSTOPERATIVE DIAGNOSIS: Visually significant cataract of the left eye.   OPERATIVE PROCEDURE: Cataract extraction by phacoemulsification with implant of intraocular lens to left eye.   SURGEON: Birder Robson, MD.   ANESTHESIA:  1. Managed anesthesia care.  2. Topical tetracaine drops followed by 2% Xylocaine jelly applied in the preoperative holding area.   COMPLICATIONS: None.   TECHNIQUE:  Stop-and-chop    DESCRIPTION OF PROCEDURE: The patient was examined and consented in the preoperative holding area where the aforementioned topical anesthesia was applied to the left eye and then brought back to the Operating Room where the left eye was prepped and draped in the usual sterile ophthalmic fashion and a lid speculum was placed. A paracentesis was created with the side port blade and the anterior chamber was filled with viscoelastic. A near clear corneal incision was performed with the steel keratome. A continuous curvilinear capsulorrhexis was performed with a cystotome followed by the capsulorrhexis forceps. Hydrodissection and hydrodelineation were carried out with BSS on a blunt cannula. The lens was removed in a stop-and-chop technique and the remaining cortical material was removed with the irrigation-aspiration handpiece. The capsular bag was inflated with viscoelastic and the Technus ZCB00 21.0-diopter lens, serial number [id number] was placed in the capsular bag without complication. The remaining viscoelastic was removed from the eye with the irrigation-aspiration handpiece. The wounds were hydrated. The anterior chamber was flushed with Miostat and the eye was inflated to physiologic pressure. The wounds were found to be water tight. The eye was dressed with Vigamox. The patient was given protective glasses to  wear throughout the day and a shield with which to sleep tonight. The patient was also given drops with which to begin a drop regimen today and will follow-up with me in one day.   ____________________________ Livingston Diones. Latorsha Curling, MD wlp:drc D: 04/26/2012 10:48:49 ET T: 04/26/2012 11:10:54 ET JOB#: [id number]  cc: Carliyah Cotterman L. Vasti Yagi, MD, <Dictator> Livingston Diones Nyellie Yetter MD ELECTRONICALLY SIGNED 05/05/2012 13:37
